# Patient Record
Sex: Female | Born: 1945 | Race: Black or African American | Hispanic: No | Marital: Single | Smoking: Never smoker
Health system: Southern US, Community
[De-identification: ages and names within clinical notes are randomized; demographics above are authoritative.]

## PROBLEM LIST (undated history)

## (undated) DIAGNOSIS — Z9989 Dependence on other enabling machines and devices: Secondary | ICD-10-CM

## (undated) DIAGNOSIS — I10 Essential (primary) hypertension: Secondary | ICD-10-CM

## (undated) HISTORY — PX: ABDOMINAL HYSTERECTOMY: SHX81

---

## 2011-09-04 ENCOUNTER — Encounter: Payer: Self-pay | Admitting: *Deleted

## 2011-09-04 ENCOUNTER — Emergency Department (INDEPENDENT_AMBULATORY_CARE_PROVIDER_SITE_OTHER)
Admission: EM | Admit: 2011-09-04 | Discharge: 2011-09-04 | Disposition: A | Discharge status: Home / Self Care | Payer: Federal, State, Local not specified - PPO | Source: Home / Self Care | Attending: Emergency Medicine | Admitting: Emergency Medicine

## 2011-09-04 DIAGNOSIS — R6889 Other general symptoms and signs: Secondary | ICD-10-CM

## 2011-09-04 HISTORY — DX: Essential (primary) hypertension: I10

## 2011-09-04 MED ORDER — OSELTAMIVIR PHOSPHATE 75 MG PO CAPS: 75.0000 mg | ORAL_CAPSULE | Freq: Two times a day (BID) | ORAL | Status: AC

## 2011-09-04 MED ORDER — GUAIFENESIN-CODEINE 100-10 MG/5ML PO SYRP: 10.0000 mL | ORAL_SOLUTION | Freq: Four times a day (QID) | ORAL | Status: AC | PRN

## 2011-09-04 NOTE — ED Provider Notes (Signed)
Chief Complaint:  "Cough and congestion"  History of Present Illness:  Sydney Daugherty is a 66 year old female who is visiting her sister in [place]. She has had a two-day history of cough productive of clear sputum, headache, malaise, fatigue, myalgias, chills, temperature of 101, nasal congestion, rhinorrhea, and nausea. She denies any chest pain, shortness of breath, or wheezing and she has had no sore throat, vomiting, or diarrhea. She has gotten the flu vaccine.  Review of Systems:  Other than noted above, the patient denies any of the following symptoms. Systemic:  No fever, chills, sweats, fatigue, myalgias, headache, or anorexia. Eye:  No redness, pain or drainage. ENT:  No earache, nasal congestion, rhinorrhea, sinus pressure, or sore throat. Lungs:  No cough, sputum production, wheezing, shortness of breath. Or chest pain. GI:  No nausea, vomiting, abdominal pain or diarrhea.  PMFSH:  Past medical history, family history, social history, meds, and allergies were reviewed.  Physical Exam:   Vital signs:  BP 140/82  Pulse 95  Temp(Src) 99.8 F (37.7 C) (Oral)  Resp 20  SpO2 97% General:  Alert, in no distress. Eye:  No conjunctival injection or drainage. ENT:  TMs and canals were normal, without erythema or inflammation.  Nasal mucosa was clear and uncongested, without drainage.  Mucous membranes were moist.  Pharynx was clear, without exudate or drainage.  There were no oral ulcerations or lesions. Neck:  Supple, no adenopathy, tenderness or mass. Lungs:  No respiratory distress.  Lungs were clear to auscultation, without wheezes, rales or rhonchi.  Breath sounds were clear and equal bilaterally. Heart:  Regular rhythm, without gallops, murmers or rubs. Skin:  Clear, warm, and dry, without rash or lesions.  Labs:  No results found for this or any previous visit.   Radiology:  No results found.  Assessment:   Diagnoses that have been ruled out:  Diagnoses that are still  under consideration:  Final diagnoses:  Influenza-like illness    Plan:   1.  The following meds were prescribed:   New Prescriptions   GUAIFENESIN-CODEINE (GUIATUSS AC) 100-10 MG/5ML SYRUP    Take 10 mLs by mouth 4 (four) times daily as needed for cough.   OSELTAMIVIR (TAMIFLU) 75 MG CAPSULE    Take 1 capsule (75 mg total) by mouth every 12 (twelve) hours.   2.  The patient was instructed in symptomatic care and handouts were given. 3.  The patient was told to return if becoming worse in any way, if no better in 3 or 4 days, and given some red flag symptoms that would indicate earlier return.   Roque Lias, MD 09/04/11 [phone]

## 2011-09-04 NOTE — ED Notes (Signed)
Pt is here with complaints of cough, fever and sinus HA.  Pt was treated 2 weeks ago for sinus infection with antibx.  Has history of asthma and environmental allergies.  Pt sees an allergist, has an appointment tomorrow.

## 2017-08-27 ENCOUNTER — Emergency Department (HOSPITAL_COMMUNITY): Payer: Federal, State, Local not specified - PPO

## 2017-08-27 ENCOUNTER — Emergency Department (HOSPITAL_COMMUNITY)
Admission: EM | Admit: 2017-08-27 | Discharge: 2017-08-27 | Disposition: A | Discharge status: Home / Self Care | Payer: Federal, State, Local not specified - PPO | Attending: Emergency Medicine | Admitting: Emergency Medicine

## 2017-08-27 ENCOUNTER — Other Ambulatory Visit: Payer: Self-pay

## 2017-08-27 ENCOUNTER — Encounter (HOSPITAL_COMMUNITY): Payer: Self-pay | Admitting: Emergency Medicine

## 2017-08-27 DIAGNOSIS — J45909 Unspecified asthma, uncomplicated: Secondary | ICD-10-CM | POA: Insufficient documentation

## 2017-08-27 DIAGNOSIS — Z79899 Other long term (current) drug therapy: Secondary | ICD-10-CM | POA: Diagnosis not present

## 2017-08-27 DIAGNOSIS — H60502 Unspecified acute noninfective otitis externa, left ear: Secondary | ICD-10-CM | POA: Diagnosis not present

## 2017-08-27 DIAGNOSIS — Z7984 Long term (current) use of oral hypoglycemic drugs: Secondary | ICD-10-CM | POA: Diagnosis not present

## 2017-08-27 DIAGNOSIS — E119 Type 2 diabetes mellitus without complications: Secondary | ICD-10-CM | POA: Insufficient documentation

## 2017-08-27 DIAGNOSIS — J209 Acute bronchitis, unspecified: Secondary | ICD-10-CM

## 2017-08-27 DIAGNOSIS — I10 Essential (primary) hypertension: Secondary | ICD-10-CM | POA: Diagnosis not present

## 2017-08-27 DIAGNOSIS — H9202 Otalgia, left ear: Secondary | ICD-10-CM | POA: Diagnosis present

## 2017-08-27 DIAGNOSIS — R0981 Nasal congestion: Secondary | ICD-10-CM

## 2017-08-27 HISTORY — DX: Dependence on other enabling machines and devices: Z99.89

## 2017-08-27 LAB — CBC WITH DIFFERENTIAL/PLATELET
Basophils Absolute: 0 10*3/uL (ref 0.0–0.1)
Basophils Relative: 1 %
Eosinophils Absolute: 0.2 10*3/uL (ref 0.0–0.7)
Eosinophils Relative: 2 %
HCT: 41.9 % (ref 36.0–46.0)
Hemoglobin: 13.5 g/dL (ref 12.0–15.0)
Lymphocytes Relative: 31 %
Lymphs Abs: 2.7 10*3/uL (ref 0.7–4.0)
MCH: 26.4 pg (ref 26.0–34.0)
MCHC: 32.2 g/dL (ref 30.0–36.0)
MCV: 81.8 fL (ref 78.0–100.0)
Monocytes Absolute: 0.7 10*3/uL (ref 0.1–1.0)
Monocytes Relative: 8 %
Neutro Abs: 5.3 10*3/uL (ref 1.7–7.7)
Neutrophils Relative %: 60 %
Platelets: 263 10*3/uL (ref 150–400)
RBC: 5.12 MIL/uL — ABNORMAL HIGH (ref 3.87–5.11)
RDW: 15.2 % (ref 11.5–15.5)
WBC: 8.8 10*3/uL (ref 4.0–10.5)

## 2017-08-27 LAB — I-STAT TROPONIN, ED: Troponin i, poc: 0.01 ng/mL (ref 0.00–0.08)

## 2017-08-27 LAB — BASIC METABOLIC PANEL
Anion gap: 6 (ref 5–15)
BUN: 15 mg/dL (ref 6–20)
CO2: 24 mmol/L (ref 22–32)
Calcium: 9.9 mg/dL (ref 8.9–10.3)
Chloride: 110 mmol/L (ref 101–111)
Creatinine, Ser: 1.05 mg/dL — ABNORMAL HIGH (ref 0.44–1.00)
GFR calc Af Amer: >60 mL/min (ref >60)
GFR calc non Af Amer: 52 mL/min — ABNORMAL LOW (ref >60)
Glucose, Bld: 87 mg/dL (ref 65–99)
Potassium: 4.3 mmol/L (ref 3.5–5.1)
Sodium: 140 mmol/L (ref 135–145)

## 2017-08-27 MED ORDER — DOXYCYCLINE HYCLATE 100 MG PO TABS
100.0000 mg | ORAL_TABLET | Freq: Once | ORAL | Status: AC
  Administered 2017-08-27: 100 mg via ORAL
  Filled 2017-08-27: qty 1

## 2017-08-27 MED ORDER — CIPROFLOXACIN-DEXAMETHASONE 0.3-0.1 % OT SUSP
4.0000 [drp] | Freq: Once | OTIC | Status: AC
  Administered 2017-08-27: 4 [drp] via OTIC
  Filled 2017-08-27: qty 7.5

## 2017-08-27 MED ORDER — DOXYCYCLINE HYCLATE 100 MG PO CAPS: 100.0000 mg | ORAL_CAPSULE | Freq: Two times a day (BID) | ORAL | 0 refills | Status: AC

## 2017-08-27 NOTE — ED Triage Notes (Signed)
[place] visiting from [place], started having shortness of breath last night, with a fever, left ear pain, congestion.  Pt has had her flu shot, works for [employer]- full time.

## 2017-08-27 NOTE — ED Provider Notes (Signed)
[facility] EMERGENCY [facility] Provider Note   CSN: [id number] Arrival date & time: 08/27/17  1433     History   Chief Complaint Chief Complaint  Patient presents with  . URI  . Fever    HPI Sydney Daugherty is a 71 y.o. female   With history of asthma, DM, HTN presents with chief complaint acute onset, progressively worsening left ear pain and nasal congestion since yesterday.  Patient is traveling from [place] and visiting family for the holidays.  She states that she works at the [employer] it is frequently around physicians and medical professionals that are around sick people she states that on her train ride down she was also in close proximity to a woman  with a cough.  She states that when she arrived to her sister's house, her sister noted she appeared short of breath.  Patient notes intermittent shortness of breath which worsens with activity.  Also endorses orthopnea.  She states she also developed aching left ear pain and left-sided facial pain with radiation to the neck.  She endorses mild sore throat.  Also endorses nasal congestion.  She has tried over-the-counter eardrops and cold medicines with some relief.  She endorses subjective fevers and chills.  She also notes bilateral lower extremity edema which developed yesterday.  She endorses nonproductive cough.  No recent travel or surgeries, no hemoptysis, no prior history of DVT or PE. She is a nonsmoker.  She had a short period of nausea after her meal earlier today but denies abdominal pain, vomiting, or other abdominal symptoms.  The history is provided by the patient.    Past Medical History:  Diagnosis Date  . Asthma   . CPAP (continuous positive airway pressure) dependence    as needed not used in 3-4 years  . Diabetes mellitus   . Hypertension     There are no active problems to display for this patient.   Past Surgical History:  Procedure Laterality Date  . ABDOMINAL HYSTERECTOMY      OB  History    No data available       Home Medications    Prior to Admission medications   Medication Sig Start Date End Date Taking? Authorizing Provider  albuterol (PROVENTIL HFA;VENTOLIN HFA) 108 (90 BASE) MCG/ACT inhaler Inhale 2 puffs into the lungs every 6 (six) hours as needed.     Yes [provider]  amLODipine-olmesartan (AZOR) 10-40 MG tablet Take 1 tablet by mouth daily. 07/30/17  Yes [provider]  B Complex Vitamins (VITAMIN B COMPLEX PO) Take 1 tablet by mouth daily.   Yes [provider]  CLONIDINE HCL PO Take 0.5 mg by mouth at bedtime.   Yes [provider]  ezetimibe-simvastatin (VYTORIN) 10-80 MG per tablet Take 1 tablet by mouth at bedtime.     Yes [provider]  Garlic 10 MG CAPS Take 1 tablet by mouth daily as needed.   Yes [provider]  glyBURIDE (DIABETA) 5 MG tablet Take 5 mg by mouth 2 (two) times daily with a meal.    Yes [provider]  Levothyroxine Sodium 137 MCG CAPS Take 137 mcg by mouth daily.    Yes [provider]  metFORMIN (GLUCOPHAGE) 500 MG tablet Take 500 mg by mouth 2 (two) times daily with a meal.     Yes [provider]  Multiple Vitamin (MULTIVITAMIN) capsule Take 1 capsule by mouth daily.   Yes [provider]  niacin (NIASPAN)  500 MG CR tablet Take 500 mg by mouth at bedtime.  08/01/17  Yes [provider]  SYMBICORT 160-4.5 MCG/ACT inhaler Inhale 2 puffs into the lungs 2 (two) times daily. 08/23/17  Yes [provider]  triamcinolone (NASACORT) 55 MCG/ACT nasal inhaler Place 2 sprays into the nose daily.     Yes [provider]  doxycycline (VIBRAMYCIN) 100 MG capsule Take 1 capsule (100 mg total) by mouth 2 (two) times daily for 7 days. 08/27/17 09/03/17  Jeanie SewerFawze, Lannis Lichtenwalner A, PA-C    Family History No family history on file.  Social History Social History   Tobacco Use  . Smoking status: Never Smoker  . Smokeless tobacco:  Never Used  Substance Use Topics  . Alcohol use: No  . Drug use: No     Allergies   Patient has no known allergies.   Review of Systems Review of Systems  Constitutional: Positive for chills and fever.  HENT: Positive for congestion, ear pain, sinus pressure and sore throat. Negative for drooling and ear discharge.   Respiratory: Positive for cough and shortness of breath.   Cardiovascular: Negative for chest pain.  Gastrointestinal: Positive for nausea. Negative for abdominal pain and vomiting.  All other systems reviewed and are negative.    Physical Exam Updated Vital Signs BP 137/60   Pulse 70   Temp 98.9 F (37.2 C) (Oral)   Resp 13   Ht 5\' 8"  (1.727 m)   Wt 108.9 kg (240 lb)   SpO2 98%   BMI 36.49 kg/m   Physical Exam  Constitutional: She is oriented to person, place, and time. She appears well-developed and well-nourished. No distress.  HENT:  Head: Normocephalic and atraumatic.  Right Ear: External ear normal.  Left Ear: External ear normal.  Left TM with mid-ear effusion, mild erythema, no bulging.  There is mild erythema and edema of the left ear canal.  No pain with palpation of the pinna, tragus, or mastoids bilaterally.  No abnormal drainage noted.  Left maxillary sinus tenderness to palpation with mucosal edema noted.  Nasal septum is midline.  Posterior oropharynx with postnasal drip but no tonsillar hypertrophy, exudates, uvular deviation, trismus, or sublingual abnormalities.  No facial swelling noted.  Eyes: Conjunctivae and EOM are normal. Pupils are equal, round, and reactive to light. Right eye exhibits no discharge. Left eye exhibits no discharge.  Neck: Normal range of motion. Neck supple. No JVD present. No tracheal deviation present.  Left anterior cervical LAD  Cardiovascular: Normal rate, regular rhythm, normal heart sounds and intact distal pulses.  2+ radial and DP/PT pulses bl, negative Homan's bl, 2+ nonpitting edema to the BLE.     Pulmonary/Chest: Effort normal and breath sounds normal. No stridor. No respiratory distress. She has no wheezes. She has no rales. She exhibits no tenderness.  Abdominal: Soft. Bowel sounds are normal. She exhibits no distension. There is no tenderness.  Musculoskeletal: Normal range of motion. She exhibits no edema.  Lymphadenopathy:    She has cervical adenopathy.  Neurological: She is alert and oriented to person, place, and time.  Skin: Skin is warm and dry. No erythema.  Psychiatric: She has a normal mood and affect. Her behavior is normal.  Nursing note and vitals reviewed.    ED Treatments / Results  Labs (all labs ordered are listed, but only abnormal results are displayed) Labs Reviewed  BASIC METABOLIC PANEL - Abnormal; Notable for the following components:      Result Value  Creatinine, Ser 1.05 (*)    GFR calc non Af Amer 52 (*)    All other components within normal limits  CBC WITH DIFFERENTIAL/PLATELET - Abnormal; Notable for the following components:   RBC 5.12 (*)    All other components within normal limits  I-STAT TROPONIN, ED    EKG  EKG Interpretation  Date/Time:  Tuesday August 27 2017 18:47:56 EST Ventricular Rate:  76 PR Interval:    QRS Duration: 84 QT Interval:  404 QTC Calculation: 455 R Axis:   -13 Text Interpretation:  Sinus rhythm Anterior infarct, old No old tracing to compare Confirmed by Shaune Pollack [phone]) on 08/27/2017 7:23:40 PM       Radiology Dg Chest 2 View  Result Date: 08/27/2017 CLINICAL DATA:  71 year old female with shortness of breath. EXAM: CHEST  2 VIEW COMPARISON:  None. FINDINGS: The lungs are clear. There is no pleural effusion or pneumothorax. The cardiac silhouette is within normal limits. There is a moderate size hiatal hernia. No acute osseous pathology. IMPRESSION: 1. No acute cardiopulmonary process. 2. Moderate hiatal hernia. Electronically Signed   By: Elgie Collard M.D.   On: 08/27/2017 19:05     Procedures Procedures (including critical care time)  Medications Ordered in ED Medications  doxycycline (VIBRA-TABS) tablet 100 mg (100 mg Oral Given 08/27/17 2014)  ciprofloxacin-dexamethasone (CIPRODEX) 0.3-0.1 % OTIC (EAR) suspension 4 drop (4 drops Left EAR Given 08/27/17 2015)     Initial Impression / Assessment and Plan / ED Course  I have reviewed the triage vital signs and the nursing notes.  Pertinent labs & imaging results that were available during my care of the patient were reviewed by me and considered in my medical decision making (see chart for details).     Patient with left ear pain, nasal congestion, and cough as well as shortness of breath.  Afebrile, vital signs are stable.  Lungs are clear to auscultation.  Chest x-ray shows no acute cardia pulmonary abnormality such as pneumonia or pleural effusion.  I doubt CHF exacerbation in a patient who is otherwise relatively healthy and has no prior history of CHF.  Doubt ACS or MI given no chest pain.  Lab work is reassuring, with no leukocytosis or significant electrolyte abnormalities.  History and physical examination consistent with acute bronchitis and URI.  She appears to have a slight left-sided otitis externa which presumably occurred while using a Q-tip with over-the-counter medication to treat her earache.  Will give her Ciprodex drops in the ED to take home and use for the next 5 days.  We will also discharged with doxycycline for bronchitis.  Discussed symptomatic treatment for her symptoms as well.  Discussed indications for return to the ED.  Pt verbalized understanding of and agreement with plan and is safe for discharge home at this time.  Patient has no complaints prior to discharge.  Patient seen and evaluated by Dr. Erma Heritage who agrees with assessment and plan at this time.  Final Clinical Impressions(s) / ED Diagnoses   Final diagnoses:  Acute noninfective otitis externa of left ear, unspecified type   Nasal congestion  Acute bronchitis, unspecified organism    ED Discharge Orders        Ordered    doxycycline (VIBRAMYCIN) 100 MG capsule  2 times daily     08/27/17 1958       Bennye Alm 08/28/17 0026    Shaune Pollack, MD 08/28/17 [phone]

## 2017-08-27 NOTE — ED Notes (Signed)
Pt taken to Xray.

## 2017-08-27 NOTE — Discharge Instructions (Signed)
Please take all of your antibiotics until finished!   You may develop abdominal discomfort or diarrhea from the antibiotic.  You may help offset this with probiotics which you can buy or get in yogurt. Do not eat  or take the probiotics until 2 hours after your antibiotic. Apply your antibiotic drops to the left ear as directed. You appear to have a mild external ear infection in the left ear.    Continue to stay well-hydrated. Gargle warm salt water and spit it out for sore throat. May also use cough drops, warm teas, etc. Take flonase to decrease nasal congestion.    Followup with your primary care doctor in 5-7 days for recheck of ongoing symptoms. Return to emergency department for emergent changing or worsening of symptoms such as throat tightness, facial swelling, fever not controlled by ibuprofen or Tylenol,difficulty breathing, or chest pain.

## 2017-09-03 ENCOUNTER — Other Ambulatory Visit: Payer: Self-pay

## 2017-09-03 ENCOUNTER — Encounter (HOSPITAL_COMMUNITY): Payer: Self-pay | Admitting: Emergency Medicine

## 2017-09-03 ENCOUNTER — Ambulatory Visit (HOSPITAL_COMMUNITY)
Admission: EM | Admit: 2017-09-03 | Discharge: 2017-09-03 | Disposition: A | Discharge status: Home / Self Care | Payer: Federal, State, Local not specified - PPO

## 2017-09-03 DIAGNOSIS — B349 Viral infection, unspecified: Secondary | ICD-10-CM | POA: Diagnosis not present

## 2017-09-03 NOTE — Discharge Instructions (Signed)
Stay the course.  Okay to take tylenol 1000 mg every 8 hours for pain as needed.

## 2017-09-03 NOTE — ED Triage Notes (Signed)
Pt was in the ER christmas dx with bronchitis, was given antibiotics and ear drops. Pt states shes been feeling better but the L ear still hurts.

## 2017-09-03 NOTE — ED Provider Notes (Signed)
09/03/2017 11:42 AM   DOB: 10/15/1945 / MRN: [id number]  SUBJECTIVE:  Sydney Daugherty is a 72 y.o. female presenting for ear pain.  Was seen in the ED on christmas and was diagnosed with brohnchitis and otitis externa and was given doxy and cipro drop. [place] to [place] tomorrow on the train.  She overall is feeling better. She takes symbicort and nasacort for allergies and mild asthma.    She has No Known Allergies.   She  has a past medical history of Asthma, CPAP (continuous positive airway pressure) dependence, Diabetes mellitus, and Hypertension.    She  reports that  has never smoked. she has never used smokeless tobacco. She reports that she does not drink alcohol or use drugs. She  has no sexual activity history on file. The patient  has a past surgical history that includes Abdominal hysterectomy.  Her family history is not on file.  Review of Systems  Constitutional: Negative for chills, diaphoresis, fever and malaise/fatigue.  HENT: Positive for congestion.   Respiratory: Positive for cough and sputum production. Negative for shortness of breath and wheezing.   Cardiovascular: Negative for chest pain.  Gastrointestinal: Negative for nausea.  Genitourinary: Negative.   Skin: Negative for itching and rash.  Neurological: Negative for dizziness.    OBJECTIVE:  BP (!) 156/94 (BP Location: Left Arm) Comment: notified tina  Pulse 66   Temp 98.2 F (36.8 C) (Oral)   Resp 18   SpO2 97%   Physical Exam  Constitutional: She is oriented to person, place, and time. She is active.  Non-toxic appearance.  HENT:  Right Ear: Hearing, tympanic membrane, external ear and ear canal normal.  Left Ear: Hearing, tympanic membrane, external ear and ear canal normal.  Nose: Nose normal. Right sinus exhibits no maxillary sinus tenderness and no frontal sinus tenderness. Left sinus exhibits no maxillary sinus tenderness and no frontal sinus tenderness.  Mouth/Throat: Uvula  is midline, oropharynx is clear and moist and mucous membranes are normal. Mucous membranes are not dry. No oropharyngeal exudate, posterior oropharyngeal edema or tonsillar abscesses.  Eyes: EOM are normal. Pupils are equal, round, and reactive to light.  Cardiovascular: Normal rate, regular rhythm, S1 normal, S2 normal, normal heart sounds and intact distal pulses. Exam reveals no gallop, no friction rub and no decreased pulses.  No murmur heard. Pulmonary/Chest: Effort normal and breath sounds normal. No stridor. No tachypnea. No respiratory distress. She has no wheezes. She has no rales.  Abdominal: She exhibits no distension. There is no tenderness. There is no rebound and no guarding.  Musculoskeletal: She exhibits no edema.  Lymphadenopathy:       Head (right side): No submandibular and no tonsillar adenopathy present.       Head (left side): No submandibular and no tonsillar adenopathy present.    She has no cervical adenopathy.  Neurological: She is alert and oriented to person, place, and time. She has normal strength and normal reflexes. She is not disoriented. She displays no atrophy and normal reflexes. No cranial nerve deficit or sensory deficit. She exhibits normal muscle tone. Coordination and gait normal.  Skin: Skin is warm and dry. She is not diaphoretic. No pallor.  Psychiatric: Her behavior is normal.    No results found for this or any previous visit (from the past 72 hour(s)).  No results found.  ASSESSMENT AND PLAN:  The encounter diagnosis was Viral syndrome. She looks great today.  She is finishing doxy and cipro  drops.     The patient is advised to call or return to clinic if she does not see an improvement in symptoms, or to seek the care of the closest emergency department if she worsens with the above plan.   Deliah Boston, MHS, PA-C 09/03/2017 11:42 AM    Ofilia Neas, PA-C 09/03/17 1143

## 2018-05-30 DIAGNOSIS — K449 Diaphragmatic hernia without obstruction or gangrene: Secondary | ICD-10-CM | POA: Insufficient documentation

## 2018-06-13 DIAGNOSIS — D124 Benign neoplasm of descending colon: Secondary | ICD-10-CM | POA: Insufficient documentation

## 2019-07-07 IMAGING — DX DG CHEST 2V
2 series · 2 of 2 positions shown · non-contrast
Comparison: None.

CLINICAL DATA: 71-year-old female with shortness of breath.

EXAM:
CHEST  2 VIEW

[w chest pa]
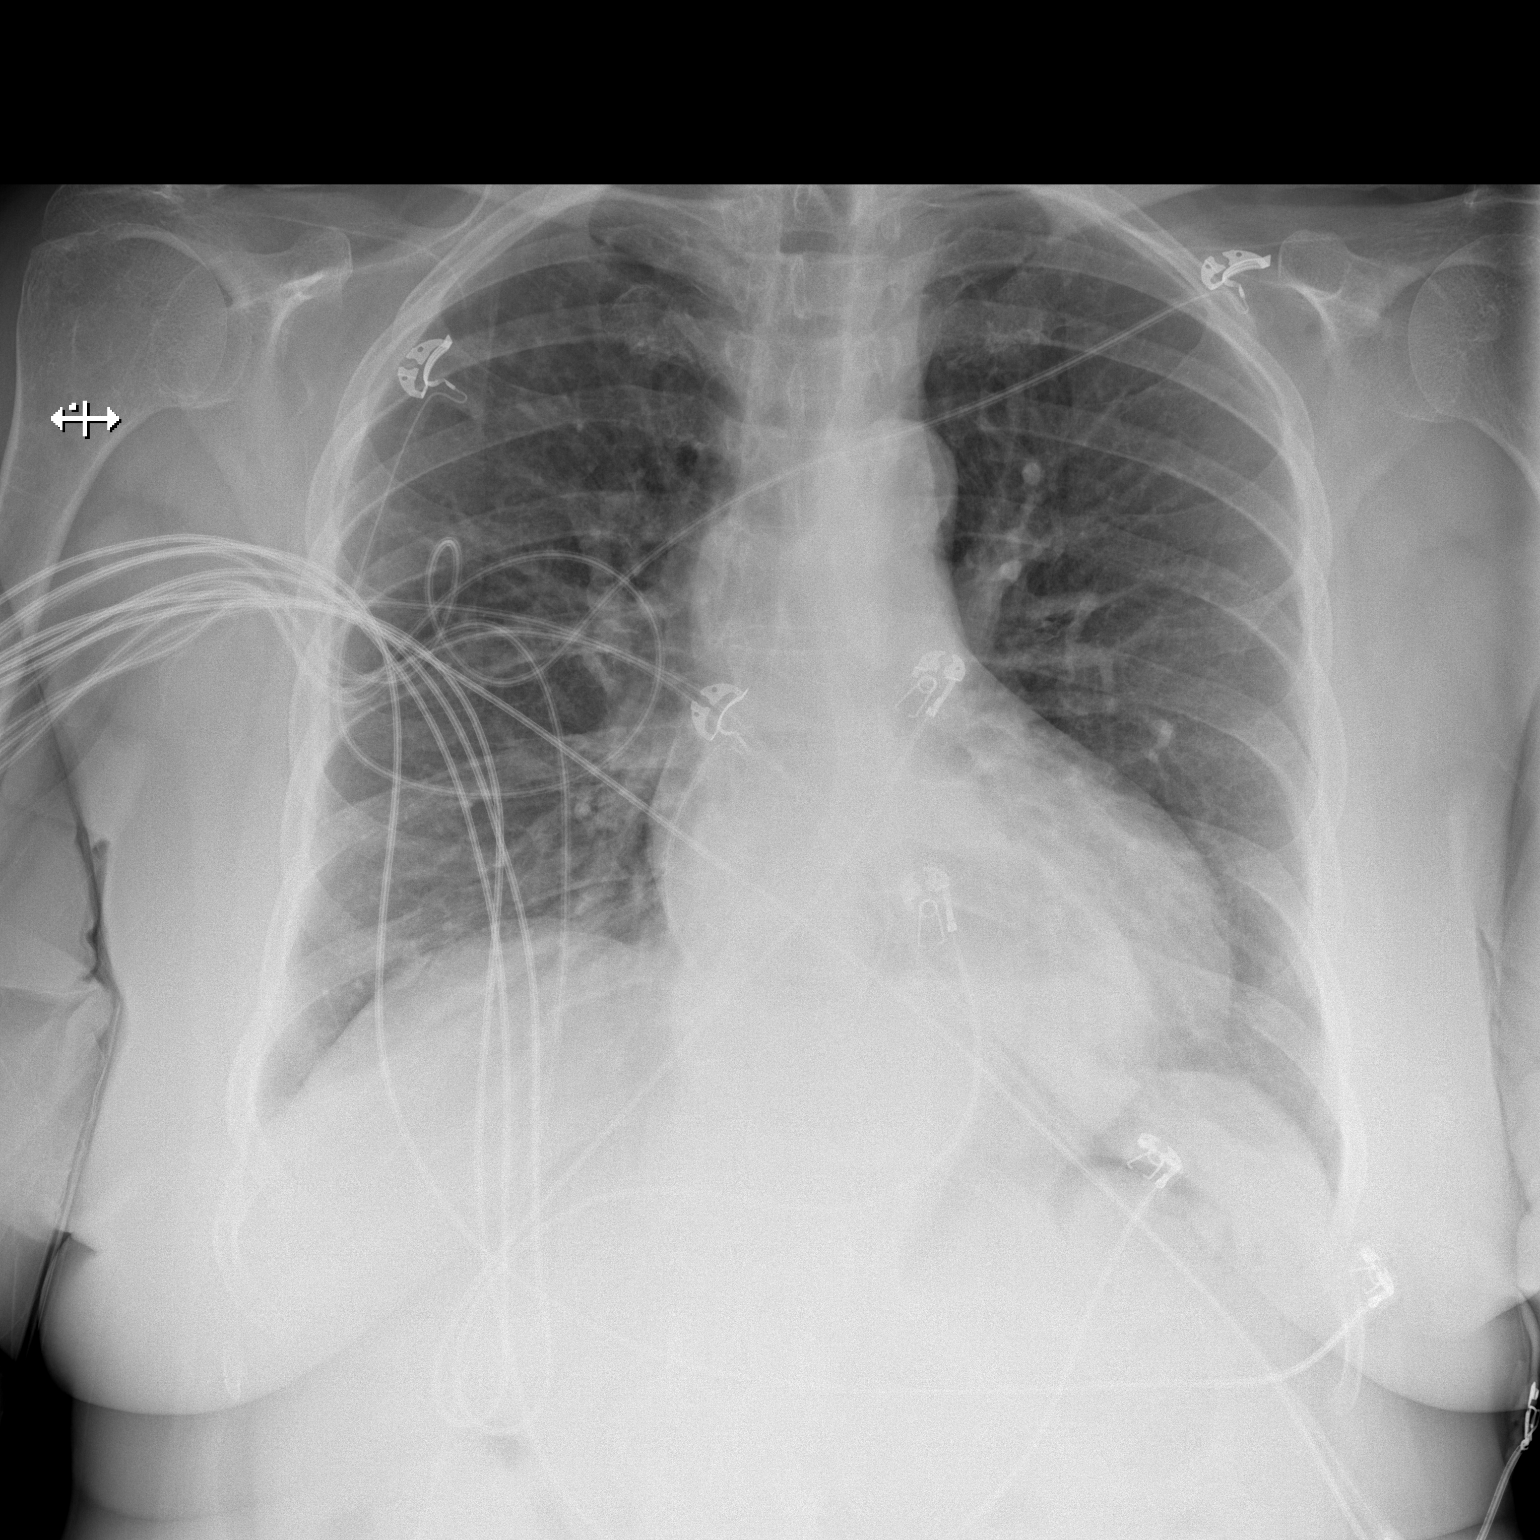

[w chest lat]
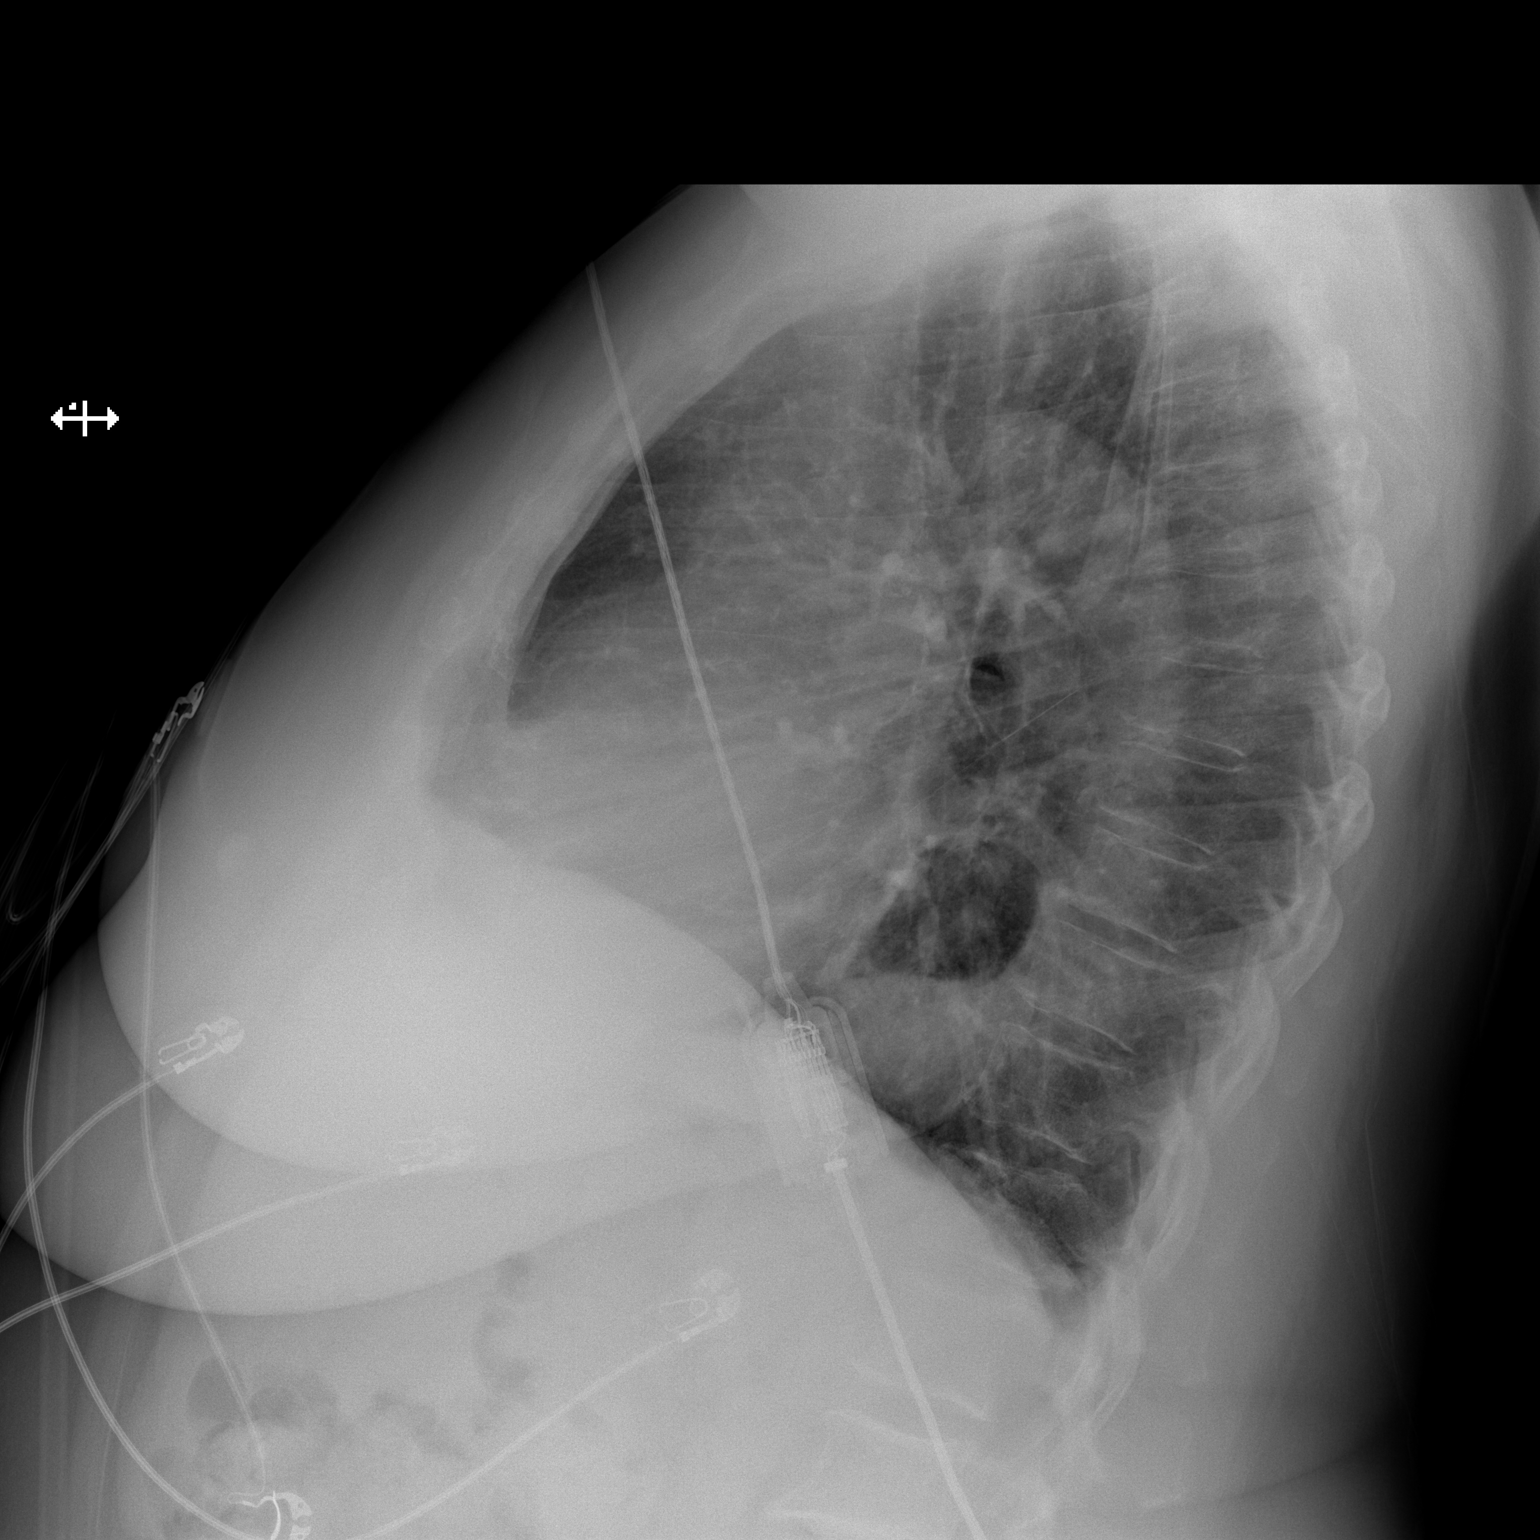

[2 of 2 positions shown; findings below may reference images not displayed]

FINDINGS: The lungs are clear. There is no pleural effusion or pneumothorax.
The cardiac silhouette is within normal limits. There is a moderate
size hiatal hernia. No acute osseous pathology.
IMPRESSION: 1. No acute cardiopulmonary process.
2. Moderate hiatal hernia.

## 2022-05-22 DIAGNOSIS — K5730 Diverticulosis of large intestine without perforation or abscess without bleeding: Secondary | ICD-10-CM | POA: Insufficient documentation

## 2023-03-11 ENCOUNTER — Other Ambulatory Visit: Payer: Self-pay

## 2023-03-11 MED ORDER — LEVOTHYROXINE SODIUM 125 MCG PO TABS
125.0000 ug | ORAL_TABLET | Freq: Every day | ORAL | 8 refills | Status: AC
  Filled 2023-03-11: qty 90, 90d supply, fill #0
  Filled 2023-03-11: qty 30, 30d supply, fill #0

## 2023-03-11 MED ORDER — MOUNJARO 7.5 MG/0.5ML ~~LOC~~ SOAJ
7.5000 mg | SUBCUTANEOUS | 0 refills | Status: AC
  Filled 2023-03-11: qty 2, 28d supply, fill #0

## 2023-08-07 ENCOUNTER — Ambulatory Visit (HOSPITAL_COMMUNITY)
Admission: EM | Admit: 2023-08-07 | Discharge: 2023-08-07 | Disposition: A | Discharge status: Home / Self Care | Payer: Federal, State, Local not specified - PPO | Attending: Physician Assistant | Admitting: Physician Assistant

## 2023-08-07 ENCOUNTER — Encounter (HOSPITAL_COMMUNITY): Payer: Self-pay

## 2023-08-07 DIAGNOSIS — R051 Acute cough: Secondary | ICD-10-CM | POA: Insufficient documentation

## 2023-08-07 DIAGNOSIS — U071 COVID-19: Secondary | ICD-10-CM | POA: Diagnosis present

## 2023-08-07 LAB — BASIC METABOLIC PANEL
Anion gap: 11 (ref 5–15)
BUN: 11 mg/dL (ref 8–23)
CO2: 21 mmol/L — ABNORMAL LOW (ref 22–32)
Calcium: 9.8 mg/dL (ref 8.9–10.3)
Chloride: 106 mmol/L (ref 98–111)
Creatinine, Ser: 0.96 mg/dL (ref 0.44–1.00)
GFR, Estimated: >60 mL/min (ref >60)
Glucose, Bld: 122 mg/dL — ABNORMAL HIGH (ref 70–99)
Potassium: 3.5 mmol/L (ref 3.5–5.1)
Sodium: 138 mmol/L (ref 135–145)

## 2023-08-07 LAB — POC COVID19/FLU A&B COMBO
Covid Antigen, POC: POSITIVE — AB
Influenza A Antigen, POC: NEGATIVE
Influenza B Antigen, POC: NEGATIVE

## 2023-08-07 MED ORDER — PAXLOVID (300/100) 20 X 150 MG & 10 X 100MG PO TBPK: 3.0000 | ORAL_TABLET | Freq: Two times a day (BID) | ORAL | 0 refills | Status: AC

## 2023-08-07 MED ORDER — BENZONATATE 100 MG PO CAPS: 100.0000 mg | ORAL_CAPSULE | Freq: Three times a day (TID) | ORAL | 0 refills | Status: DC

## 2023-08-07 NOTE — Discharge Instructions (Signed)
You tested positive for COVID.  I have sent in Tessalon to help with your cough.  We are going to start Paxlovid which should help your body fight the virus.  I will call this in as soon as I have your blood work back and we will call you with this result.  While you are on Paxlovid, do not take your amlodipine/olmesartan and ezetimibe/simvastatin while on the medication and for 3 days after you finish it (for the next 8 days).  Use over-the-counter medications as needed.  Make sure that you rest and drink plenty of fluid.  I recommend that you obtain a pulse oximeter from the pharmacy and monitor your oxygen saturation.  If you notice your oxygen dropping below 93% you should be reevaluated if it goes below 90% you should go to the emergency room.  If anything worsens you have chest pain, worsening cough, shortness of breath, weakness you should go to the ER.

## 2023-08-07 NOTE — ED Triage Notes (Signed)
Patient here today with c/o cough, runny nose, some chills, and right ear pain since yesterday. Last week she was having some right ear pain and she had taken Allegra which seemed to help but it came back. Her sister is also sick.

## 2023-08-07 NOTE — ED Provider Notes (Addendum)
[facility]    CSN: [id number] Arrival date & time: 08/07/23  1101      History   Chief Complaint Chief Complaint  Patient presents with   Cough    HPI Sydney Daugherty is a 77 y.o. female.   Patient presents today with a 24-hour history of URI symptoms.  Reports cough, rhinorrhea, chills, otalgia.  Denies any chest pain, shortness of breath, nausea, vomiting, fever.  She reports her sister has also been sick.  She does have a history of asthma but has not required maintenance or rescue medication recently.  Denies hospitalization related to asthma.  She does not smoke and denies any history of allergies.  She has been taking Allegra regularly as well as over-the-counter Tylenol without improvement of symptoms.  She denies any recent antibiotics or steroids.  She is eating and drinking normally and denies any GI symptoms.  She has never had COVID.  She is up-to-date on COVID-19 and influenza vaccinations.    Past Medical History:  Diagnosis Date   Asthma    CPAP (continuous positive airway pressure) dependence    as needed not used in 3-4 years   Diabetes mellitus    Hypertension     There are no problems to display for this patient.   Past Surgical History:  Procedure Laterality Date   ABDOMINAL HYSTERECTOMY      OB History   No obstetric history on file.      Home Medications    Prior to Admission medications   Medication Sig Start Date End Date Taking? Authorizing Provider  benzonatate (TESSALON) 100 MG capsule Take 1 capsule (100 mg total) by mouth every 8 (eight) hours. 08/07/23  Yes Charmaine Placido K, PA-C  amLODipine-olmesartan (AZOR) 10-40 MG tablet Take 1 tablet by mouth daily. 07/30/17   [provider]  B Complex Vitamins (VITAMIN B COMPLEX PO) Take 1 tablet by mouth daily.    [provider]  CLONIDINE HCL PO Take 0.5 mg by mouth at bedtime.    [provider]  ezetimibe-simvastatin (VYTORIN) 10-80 MG per tablet  Take 1 tablet by mouth at bedtime.      [provider]  Garlic 10 MG CAPS Take 1 tablet by mouth daily as needed.    [provider]  levothyroxine (SYNTHROID) 125 MCG tablet Take 1 tablet (125 mcg total) by mouth daily. 11/13/22     metFORMIN (GLUCOPHAGE) 500 MG tablet Take 500 mg by mouth 2 (two) times daily with a meal.      [provider]  Multiple Vitamin (MULTIVITAMIN) capsule Take 1 capsule by mouth daily.    [provider]  tirzepatide Greggory Keen) 7.5 MG/0.5ML Pen Inject 7.5 mg into the skin once a week. 10/16/22     triamcinolone (NASACORT) 55 MCG/ACT nasal inhaler Place 2 sprays into the nose daily.      [provider]    Family History History reviewed. No pertinent family history.  Social History Social History   Tobacco Use   Smoking status: Never   Smokeless tobacco: Never  Vaping Use   Vaping status: Never Used  Substance Use Topics   Alcohol use: No   Drug use: No     Allergies   Patient has no known allergies.   Review of Systems Review of Systems  Constitutional:  Positive for activity change and chills. Negative for appetite change, fatigue and fever.  HENT:  Positive for congestion and ear pain. Negative for postnasal drip, sinus  pressure, sneezing and sore throat.   Respiratory:  Positive for cough. Negative for shortness of breath.   Cardiovascular:  Negative for chest pain.  Gastrointestinal:  Negative for abdominal pain, diarrhea, nausea and vomiting.  Neurological:  Negative for dizziness, light-headedness and headaches.     Physical Exam Triage Vital Signs ED Triage Vitals  Encounter Vitals Group     BP 08/07/23 1123 (!) 147/107     Systolic BP Percentile --      Diastolic BP Percentile --      Pulse Rate 08/07/23 1123 (!) 102     Resp 08/07/23 1123 16     Temp 08/07/23 1123 99.5 F (37.5 C)     Temp Source 08/07/23 1123 Oral     SpO2 08/07/23 1123 95 %     Weight 08/07/23 1123 191 lb (86.6  kg)     Height 08/07/23 1123 5\' 8"  (1.727 m)     Head Circumference --      Peak Flow --      Pain Score 08/07/23 1121 0     Pain Loc --      Pain Education --      Exclude from Growth Chart --    No data found.  Updated Vital Signs BP (!) 147/107 (BP Location: Right Arm)   Pulse 96   Temp 99.5 F (37.5 C) (Oral)   Resp 16   Ht 5\' 8"  (1.727 m)   Wt 191 lb (86.6 kg)   SpO2 97%   BMI 29.04 kg/m   Visual Acuity Right Eye Distance:   Left Eye Distance:   Bilateral Distance:    Right Eye Near:   Left Eye Near:    Bilateral Near:     Physical Exam Vitals reviewed.  Constitutional:      General: She is awake. She is not in acute distress.    Appearance: Normal appearance. She is well-developed. She is not ill-appearing.     Comments: Very pleasant female appears stated age in no acute distress sitting comfortably in exam room  HENT:     Head: Normocephalic and atraumatic.     Right Ear: Tympanic membrane, ear canal and external ear normal. Tympanic membrane is not erythematous or bulging.     Left Ear: Tympanic membrane, ear canal and external ear normal. Tympanic membrane is not erythematous or bulging.     Nose:     Right Sinus: No maxillary sinus tenderness or frontal sinus tenderness.     Left Sinus: No maxillary sinus tenderness or frontal sinus tenderness.     Mouth/Throat:     Pharynx: Uvula midline. No oropharyngeal exudate or posterior oropharyngeal erythema.  Cardiovascular:     Rate and Rhythm: Normal rate and regular rhythm.     Heart sounds: S1 normal and S2 normal. Murmur heard.  Pulmonary:     Effort: Pulmonary effort is normal.     Breath sounds: Normal breath sounds. No wheezing, rhonchi or rales.     Comments: Clear to auscultation bilaterally Musculoskeletal:     Right lower leg: No edema.     Left lower leg: No edema.  Psychiatric:        Behavior: Behavior is cooperative.      UC Treatments / Results  Labs (all labs ordered are listed,  but only abnormal results are displayed) Labs Reviewed  POC COVID19/FLU A&B COMBO - Abnormal; Notable for the following components:      Result Value   Covid Antigen, POC  Positive (*)    All other components within normal limits  BASIC METABOLIC PANEL    EKG   Radiology No results found.  Procedures Procedures (including critical care time)  Medications Ordered in UC Medications - No data to display  Initial Impression / Assessment and Plan / UC Course  I have reviewed the triage vital signs and the nursing notes.  Pertinent labs & imaging results that were available during my care of the patient were reviewed by me and considered in my medical decision making (see chart for details).     Patient is well-appearing, afebrile, nontoxic.  She was mildly tachycardic on intake but this normalized on recheck.  She tested positive for COVID-19.  Chest x-ray was deferred as she had no adventitious lung sounds and her saturation was 97%.  Given her age and history of diabetes, asthma she is a candidate for antiviral therapy.  Will start Paxlovid.  We do not have a recent metabolic panel on file or in Care Everywhere so this was ordered stat and we will dose the medication as soon as we have this result available.  She was instructed to hold her amlodipine/olmesartan and ezetimibe/simvastatin while on this medication for 3 days after completing course.  She can use Tessalon for cough.  Recommended that she rest and drink plenty of fluid.  We discussed that if her symptoms worsen in any way she would need to go to the emergency room.  She is to monitor her oxygen saturation and be reevaluated if this drops below 93% and go to the ER with any oxygen saturation at 90% or below.  Strict turn precautions were given.  All questions were answered to her satisfaction.  Final Clinical Impressions(s) / UC Diagnoses   Final diagnoses:  COVID-19  Acute cough     Discharge Instructions      You  tested positive for COVID.  I have sent in Tessalon to help with your cough.  We are going to start Paxlovid which should help your body fight the virus.  I will call this in as soon as I have your blood work back and we will call you with this result.  While you are on Paxlovid, do not take your amlodipine/olmesartan and ezetimibe/simvastatin while on the medication and for 3 days after you finish it (for the next 8 days).  Use over-the-counter medications as needed.  Make sure that you rest and drink plenty of fluid.  I recommend that you obtain a pulse oximeter from the pharmacy and monitor your oxygen saturation.  If you notice your oxygen dropping below 93% you should be reevaluated if it goes below 90% you should go to the emergency room.  If anything worsens you have chest pain, worsening cough, shortness of breath, weakness you should go to the ER.     ED Prescriptions     Medication Sig Dispense Auth. Provider   benzonatate (TESSALON) 100 MG capsule Take 1 capsule (100 mg total) by mouth every 8 (eight) hours. 21 capsule Jerusalem Wert K, PA-C      PDMP not reviewed this encounter.   Jeani Hawking, PA-C 08/07/23 1217    Miroslava Santellan, Noberto Retort, PA-C 08/07/23 1342

## 2023-08-15 ENCOUNTER — Encounter (HOSPITAL_COMMUNITY): Payer: Self-pay

## 2023-08-15 ENCOUNTER — Ambulatory Visit (HOSPITAL_COMMUNITY)
Admission: EM | Admit: 2023-08-15 | Discharge: 2023-08-15 | Disposition: A | Discharge status: Home / Self Care | Payer: Federal, State, Local not specified - PPO | Attending: Family Medicine | Admitting: Family Medicine

## 2023-08-15 DIAGNOSIS — J0100 Acute maxillary sinusitis, unspecified: Secondary | ICD-10-CM

## 2023-08-15 MED ORDER — AMOXICILLIN 500 MG PO TABS: 500.0000 mg | ORAL_TABLET | Freq: Three times a day (TID) | ORAL | 0 refills | Status: AC

## 2023-08-15 MED ORDER — FLUTICASONE PROPIONATE 50 MCG/ACT NA SUSP: 2.0000 | Freq: Every day | NASAL | 0 refills | Status: DC

## 2023-08-15 NOTE — Discharge Instructions (Addendum)
I think you have possibly developed a sinus infection  Take amoxicillin 500 mg--1 tab 3 times daily for 5 days  Fluticasone/Flonase nose spray--put 2 sprays in each nostril once daily  Please restart all your usual medications today

## 2023-08-15 NOTE — ED Provider Notes (Signed)
[facility]    CSN: [id number] Arrival date & time: 08/15/23  1027      History   Chief Complaint Chief Complaint  Patient presents with   Covid Positive   Nasal Congestion    HPI Sydney Daugherty is a 77 y.o. female.   HPI Here for nasal congestion and sinus pressure that she really noticed since yesterday.  She is also had a little bit more cough but that has improved today.  Also yesterday she felt a little tired or weak but that has improved.  No fever or chills or nausea or vomiting or diarrhea.  On December 4 she was seen here and tested positive for COVID.  She did have a lot more nasal congestion then.  Her symptoms improved and she finished the renal dosing of Paxlovid on December 9.  She was told to stop taking her statin while she was on the Paxlovid but the patient on her own decided to stop all her medications, including her antihypertensives and her medicines for diabetes.  She is allergic to Jardiance   Past Medical History:  Diagnosis Date   Asthma    CPAP (continuous positive airway pressure) dependence    as needed not used in 3-4 years   Diabetes mellitus    Hypertension     There are no active problems to display for this patient.   Past Surgical History:  Procedure Laterality Date   ABDOMINAL HYSTERECTOMY      OB History   No obstetric history on file.      Home Medications    Prior to Admission medications   Medication Sig Start Date End Date Taking? Authorizing Provider  amLODipine-olmesartan (AZOR) 10-40 MG tablet Take 1 tablet by mouth daily. 07/30/17  Yes [provider]  amoxicillin (AMOXIL) 500 MG tablet Take 1 tablet (500 mg total) by mouth in the morning, at noon, and at bedtime for 5 days. 08/15/23 08/20/23 Yes BanisterJanace Aris, MD  B Complex Vitamins (VITAMIN B COMPLEX PO) Take 1 tablet by mouth daily.   Yes [provider]  benzonatate (TESSALON) 100 MG capsule Take 1 capsule (100 mg total)  by mouth every 8 (eight) hours. 08/07/23  Yes Raspet, Erin K, PA-C  CLONIDINE HCL PO Take 0.5 mg by mouth at bedtime.   Yes [provider]  ezetimibe-simvastatin (VYTORIN) 10-80 MG per tablet Take 1 tablet by mouth at bedtime.     Yes [provider]  fluticasone (FLONASE) 50 MCG/ACT nasal spray Place 2 sprays into both nostrils daily. 08/15/23  Yes Zenia Resides, MD  Garlic 10 MG CAPS Take 1 tablet by mouth daily as needed.   Yes [provider]  levothyroxine (SYNTHROID) 125 MCG tablet Take 1 tablet (125 mcg total) by mouth daily. 11/13/22  Yes   metFORMIN (GLUCOPHAGE) 500 MG tablet Take 500 mg by mouth 2 (two) times daily with a meal.     Yes [provider]  Multiple Vitamin (MULTIVITAMIN) capsule Take 1 capsule by mouth daily.   Yes [provider]  tirzepatide Greggory Keen) 7.5 MG/0.5ML Pen Inject 7.5 mg into the skin once a week. 10/16/22  Yes   triamcinolone (NASACORT) 55 MCG/ACT nasal inhaler Place 2 sprays into the nose daily.     Yes [provider]    Family History History reviewed. No pertinent family history.  Social History Social History   Tobacco Use   Smoking status: Never   Smokeless tobacco: Never  Vaping Use  Vaping status: Never Used  Substance Use Topics   Alcohol use: No   Drug use: No     Allergies   Empagliflozin   Review of Systems Review of Systems   Physical Exam Triage Vital Signs ED Triage Vitals  Encounter Vitals Group     BP 08/15/23 1044 (!) 155/90     Systolic BP Percentile --      Diastolic BP Percentile --      Pulse Rate 08/15/23 1044 68     Resp 08/15/23 1044 18     Temp 08/15/23 1044 98.5 F (36.9 C)     Temp Source 08/15/23 1044 Oral     SpO2 08/15/23 1044 97 %     Weight 08/15/23 1044 190 lb (86.2 kg)     Height 08/15/23 1044 5\' 8"  (1.727 m)     Head Circumference --      Peak Flow --      Pain Score 08/15/23 1042 0     Pain Loc --      Pain Education --       Exclude from Growth Chart --    No data found.  Updated Vital Signs BP (!) 155/90 (BP Location: Left Arm)   Pulse 68   Temp 98.5 F (36.9 C) (Oral)   Resp 18   Ht 5\' 8"  (1.727 m)   Wt 86.2 kg   SpO2 97%   BMI 28.89 kg/m   Visual Acuity Right Eye Distance:   Left Eye Distance:   Bilateral Distance:    Right Eye Near:   Left Eye Near:    Bilateral Near:     Physical Exam Vitals reviewed.  Constitutional:      General: She is not in acute distress.    Appearance: She is not toxic-appearing.  HENT:     Right Ear: Tympanic membrane and ear canal normal.     Left Ear: Tympanic membrane and ear canal normal.     Nose: Congestion present.     Mouth/Throat:     Mouth: Mucous membranes are moist.     Comments: There is some white mucus draining in the oropharynx.  No erythema or asymmetry Eyes:     Extraocular Movements: Extraocular movements intact.     Conjunctiva/sclera: Conjunctivae normal.     Pupils: Pupils are equal, round, and reactive to light.  Cardiovascular:     Rate and Rhythm: Normal rate and regular rhythm.     Heart sounds: No murmur heard. Pulmonary:     Effort: Pulmonary effort is normal. No respiratory distress.     Breath sounds: No stridor. No wheezing, rhonchi or rales.  Musculoskeletal:     Cervical back: Neck supple.  Lymphadenopathy:     Cervical: No cervical adenopathy.  Skin:    Capillary Refill: Capillary refill takes less than 2 seconds.     Coloration: Skin is not jaundiced or pale.  Neurological:     General: No focal deficit present.     Mental Status: She is alert and oriented to person, place, and time.  Psychiatric:        Behavior: Behavior normal.      UC Treatments / Results  Labs (all labs ordered are listed, but only abnormal results are displayed) Labs Reviewed - No data to display  EKG   Radiology No results found.  Procedures Procedures (including critical care time)  Medications Ordered in UC Medications -  No data to display  Initial Impression / Assessment  and Plan / UC Course  I have reviewed the triage vital signs and the nursing notes.  Pertinent labs & imaging results that were available during my care of the patient were reviewed by me and considered in my medical decision making (see chart for details).   I believe she has developed an acute sinusitis.  Amoxicillin was sent in for 5 days and Flonase is sent in to help  I did ask her to restart all her routine medications. Final Clinical Impressions(s) / UC Diagnoses   Final diagnoses:  Acute maxillary sinusitis, recurrence not specified     Discharge Instructions      I think you have possibly developed a sinus infection  Take amoxicillin 500 mg--1 tab 3 times daily for 5 days  Fluticasone/Flonase nose spray--put 2 sprays in each nostril once daily  Please restart all your usual medications today      ED Prescriptions     Medication Sig Dispense Auth. Provider   fluticasone (FLONASE) 50 MCG/ACT nasal spray Place 2 sprays into both nostrils daily. 16 g Zenia Resides, MD   amoxicillin (AMOXIL) 500 MG tablet Take 1 tablet (500 mg total) by mouth in the morning, at noon, and at bedtime for 5 days. 15 tablet Azriel Dancy, Janace Aris, MD      PDMP not reviewed this encounter.   Zenia Resides, MD 08/15/23 1130

## 2023-08-15 NOTE — ED Triage Notes (Signed)
Nasal congestion (covid positive), weakness, and cough as of yesterday. States finished the meds she was given on Monday. Still having symptoms return as of Tuesday.

## 2024-02-24 ENCOUNTER — Encounter (HOSPITAL_COMMUNITY): Payer: Self-pay

## 2024-02-24 ENCOUNTER — Ambulatory Visit (HOSPITAL_COMMUNITY): Admission: EM | Admit: 2024-02-24 | Discharge: 2024-02-24 | Disposition: A | Discharge status: Home / Self Care

## 2024-02-24 DIAGNOSIS — T63441A Toxic effect of venom of bees, accidental (unintentional), initial encounter: Secondary | ICD-10-CM

## 2024-02-24 NOTE — Discharge Instructions (Addendum)
 You were seen today for a localized reaction to an insect sting that occurred earlier today. The area shows mild inflammation but no signs of an allergic reaction. Clean the sting site gently with soap and water and apply an ice pack for 10 to 15 minutes at a time throughout the day to reduce swelling and discomfort. Over-the-counter antihistamines like Benadryl or pain relievers such as ibuprofen may be used if needed for itching or soreness. No antibiotics are needed at this time. Monitor the area for signs of infection such as increased redness, warmth, swelling, or drainage. Seek immediate medical attention if you develop symptoms of an allergic reaction such as difficulty breathing, swelling of the face or throat, widespread hives, dizziness, or if the site becomes increasingly painful or shows signs of infection. Follow up with your primary care provider if symptoms worsen or do not improve within a few days.

## 2024-02-24 NOTE — ED Provider Notes (Signed)
 [facility]    CSN: [id number] Arrival date & time: 02/24/24  1107      History   Chief Complaint Chief Complaint  Patient presents with   Insect Bite    HPI Sydney Daugherty is a 78 y.o. female.   Discussed the use of AI scribe software for clinical note transcription with the patient, who gave verbal consent to proceed.    Sydney Daugherty is a 78 y.o. female with a bee or wasp sting that occurred this morning while unloading groceries from her car. The patient reports a stinging sensation at the site of the sting, which she initially described as stinging real bad but notes it has since decreased in intensity. The patient states she saw what she thought was a wasp or something similar fly by, and she swatted at it. Upon entering her house, she noticed the stinging sensation to the back of her right arm. In response, she applied rubbing alcohol to the affected area based on information she found online. The patient denies any itching, difficulty swallowing, or other symptoms beyond the localized stinging sensation. 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   The following portions of the patient's history were reviewed and updated as appropriate: allergies, current medications, past family history, past medical history, past social history, past surgical history, and problem list.    Past Medical History:  Diagnosis Date   Asthma    CPAP (continuous positive airway pressure) dependence    as needed not used in 3-4 years   Diabetes mellitus    Hypertension     There are no active problems to display for this patient.   Past Surgical History:  Procedure Laterality Date   ABDOMINAL HYSTERECTOMY      OB History   No obstetric history on file.      Home Medications    Prior to Admission medications   Medication Sig Start  Date End Date Taking? Authorizing Provider  Glucos-Chondroit-Hyaluron-MSM (GLUCOSAMINE CHONDROITIN JOINT PO) Take by mouth. 10/07/20  Yes [provider]  amLODipine-olmesartan (AZOR) 10-40 MG tablet Take 1 tablet by mouth daily. 07/30/17   [provider]  B Complex Vitamins (VITAMIN B COMPLEX PO) Take 1 tablet by mouth daily.    [provider]  CLONIDINE HCL PO Take 0.5 mg by mouth at bedtime.    [provider]  ezetimibe-simvastatin (VYTORIN) 10-80 MG per tablet Take 1 tablet by mouth at bedtime.      [provider]  levothyroxine  (SYNTHROID ) 125 MCG tablet Take 1 tablet (125 mcg total) by mouth daily. 11/13/22     metFORMIN (GLUCOPHAGE) 500 MG tablet Take 500 mg by mouth 2 (two) times daily with a meal.      [provider]  Multiple Vitamin (MULTIVITAMIN) capsule Take 1 capsule by mouth daily.    [provider]  tirzepatide  (MOUNJARO ) 7.5 MG/0.5ML Pen Inject 7.5 mg into the skin once a week. 10/16/22     triamcinolone (NASACORT) 55 MCG/ACT nasal inhaler Place 2 sprays into the nose daily.      [provider]    Family History History reviewed. No pertinent family history.  Social History Social History   Tobacco Use   Smoking status: Never   Smokeless tobacco: Never  Vaping Use   Vaping status: Never Used  Substance Use Topics  Alcohol use: No   Drug use: No     Allergies   Empagliflozin   Review of Systems Review of Systems  HENT:  Negative for trouble swallowing.   Respiratory:  Negative for chest tightness and shortness of breath.   Cardiovascular:  Negative for chest pain.  Skin:  Positive for wound.  All other systems reviewed and are negative.    Physical Exam Triage Vital Signs ED Triage Vitals  Encounter Vitals Group     BP 02/24/24 1201 (!) 141/82     Girls Systolic BP Percentile --      Girls Diastolic BP Percentile --      Boys Systolic BP Percentile --      Boys Diastolic BP  Percentile --      Pulse Rate 02/24/24 1201 60     Resp 02/24/24 1201 16     Temp 02/24/24 1201 98.6 F (37 C)     Temp Source 02/24/24 1201 Oral     SpO2 02/24/24 1201 98 %     Weight --      Height --      Head Circumference --      Peak Flow --      Pain Score 02/24/24 1203 6     Pain Loc --      Pain Education --      Exclude from Growth Chart --    No data found.  Updated Vital Signs BP (!) 141/82 (BP Location: Left Arm)   Pulse 60   Temp 98.6 F (37 C) (Oral)   Resp 16   SpO2 98%   Visual Acuity Right Eye Distance:   Left Eye Distance:   Bilateral Distance:    Right Eye Near:   Left Eye Near:    Bilateral Near:     Physical Exam Vitals reviewed.  Constitutional:      General: She is not in acute distress.    Appearance: Normal appearance. She is not toxic-appearing.  HENT:     Head: Normocephalic.     Mouth/Throat:     Mouth: Mucous membranes are moist.   Eyes:     Conjunctiva/sclera: Conjunctivae normal.    Cardiovascular:     Rate and Rhythm: Normal rate and regular rhythm.     Heart sounds: Normal heart sounds.  Pulmonary:     Effort: Pulmonary effort is normal.     Breath sounds: Normal breath sounds.   Musculoskeletal:        General: Normal range of motion.   Skin:    General: Skin is warm and dry.     Findings: Wound present.     Comments: Small area of local inflammation noted at sting site of the lateral aspect of the right upper arm. No visible stinger present. (See image below)    Neurological:     General: No focal deficit present.     Mental Status: She is alert and oriented to person, place, and time.      UC Treatments / Results  Labs (all labs ordered are listed, but only abnormal results are displayed) Labs Reviewed - No data to display  EKG   Radiology No results found.  Procedures Procedures (including critical care time)  Medications Ordered in UC Medications - No data to display  Initial Impression /  Assessment and Plan / UC Course  I have reviewed the triage vital signs and the nursing notes.  Pertinent labs & imaging results that were available during my  care of the patient were reviewed by me and considered in my medical decision making (see chart for details).     The patient presents with a localized reaction to an insect sting sustained earlier today while unloading groceries. Symptoms include a stinging sensation at the site, which has improved since the incident. Examination shows mild localized inflammation without evidence of systemic allergic response. Although the patient reports a childhood bee allergy, there have been no adult reactions, and current symptoms are consistent with a normal localized response rather than an allergic one. The area was cleaned with soap and water, and the patient was advised to apply ice for relief. No antibiotics were prescribed, and the patient was educated on the difference between normal and allergic reactions to insect stings. No follow-up is necessary unless symptoms worsen or signs of systemic reaction occur.  Today's evaluation has revealed no signs of a dangerous process. Discussed diagnosis with patient and/or guardian. Patient and/or guardian aware of their diagnosis, possible red flag symptoms to watch out for and need for close follow up. Patient and/or guardian understands verbal and written discharge instructions. Patient and/or guardian comfortable with plan and disposition.  Patient and/or guardian has a clear mental status at this time, good insight into illness (after discussion and teaching) and has clear judgment to make decisions regarding their care  Documentation was completed with the aid of voice recognition software. Transcription may contain typographical errors. Final Clinical Impressions(s) / UC Diagnoses   Final diagnoses:  Bee sting, accidental or unintentional, initial encounter     Discharge Instructions      You  were seen today for a localized reaction to an insect sting that occurred earlier today. The area shows mild inflammation but no signs of an allergic reaction. Clean the sting site gently with soap and water and apply an ice pack for 10 to 15 minutes at a time throughout the day to reduce swelling and discomfort. Over-the-counter antihistamines like Benadryl or pain relievers such as ibuprofen may be used if needed for itching or soreness. No antibiotics are needed at this time. Monitor the area for signs of infection such as increased redness, warmth, swelling, or drainage. Seek immediate medical attention if you develop symptoms of an allergic reaction such as difficulty breathing, swelling of the face or throat, widespread hives, dizziness, or if the site becomes increasingly painful or shows signs of infection. Follow up with your primary care provider if symptoms worsen or do not improve within a few days.      ED Prescriptions   None    PDMP not reviewed this encounter.   Iola Lukes, [place] 02/24/24 1258

## 2024-02-24 NOTE — ED Triage Notes (Signed)
 Patient here today with c/o a bee sting about 2 hours ago on her left upper arm. Patient states that the area stings. Denies other symptoms.

## 2024-06-09 ENCOUNTER — Ambulatory Visit: Payer: Self-pay | Admitting: Podiatry

## 2024-09-22 ENCOUNTER — Encounter: Payer: Self-pay | Admitting: Podiatry

## 2024-09-22 ENCOUNTER — Ambulatory Visit: Admitting: Podiatry

## 2024-09-22 DIAGNOSIS — D125 Benign neoplasm of sigmoid colon: Secondary | ICD-10-CM | POA: Insufficient documentation

## 2024-09-22 DIAGNOSIS — E119 Type 2 diabetes mellitus without complications: Secondary | ICD-10-CM

## 2024-09-22 DIAGNOSIS — L853 Xerosis cutis: Secondary | ICD-10-CM | POA: Diagnosis not present

## 2024-09-22 DIAGNOSIS — Z0189 Encounter for other specified special examinations: Secondary | ICD-10-CM

## 2024-09-22 DIAGNOSIS — E1165 Type 2 diabetes mellitus with hyperglycemia: Secondary | ICD-10-CM | POA: Insufficient documentation

## 2024-09-22 DIAGNOSIS — M2141 Flat foot [pes planus] (acquired), right foot: Secondary | ICD-10-CM | POA: Diagnosis not present

## 2024-09-22 DIAGNOSIS — M2012 Hallux valgus (acquired), left foot: Secondary | ICD-10-CM

## 2024-09-22 DIAGNOSIS — J45909 Unspecified asthma, uncomplicated: Secondary | ICD-10-CM | POA: Insufficient documentation

## 2024-09-22 DIAGNOSIS — M79675 Pain in left toe(s): Secondary | ICD-10-CM | POA: Diagnosis not present

## 2024-09-22 DIAGNOSIS — Z83719 Family history of colon polyps, unspecified: Secondary | ICD-10-CM | POA: Insufficient documentation

## 2024-09-22 DIAGNOSIS — Z6830 Body mass index (BMI) 30.0-30.9, adult: Secondary | ICD-10-CM | POA: Insufficient documentation

## 2024-09-22 DIAGNOSIS — M2011 Hallux valgus (acquired), right foot: Secondary | ICD-10-CM | POA: Diagnosis not present

## 2024-09-22 DIAGNOSIS — M79674 Pain in right toe(s): Secondary | ICD-10-CM | POA: Diagnosis not present

## 2024-09-22 DIAGNOSIS — M1990 Unspecified osteoarthritis, unspecified site: Secondary | ICD-10-CM | POA: Insufficient documentation

## 2024-09-22 DIAGNOSIS — M2142 Flat foot [pes planus] (acquired), left foot: Secondary | ICD-10-CM | POA: Diagnosis not present

## 2024-09-22 DIAGNOSIS — B351 Tinea unguium: Secondary | ICD-10-CM | POA: Diagnosis not present

## 2024-09-22 MED ORDER — AMMONIUM LACTATE 12 % EX LOTN: TOPICAL_LOTION | CUTANEOUS | 5 refills | Status: AC

## 2024-09-27 NOTE — Progress Notes (Signed)
 "  Subjective: Sydney Daugherty presents today for for diabetic foot evaluation and with chief concern of diabetes with elongated, thickened, painful, discolored toenails for years. Aggravating factor(s) include wearing enclosed shoe gear. 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  Patient has tried prior visit with community Podiatrist. Chief Complaint  Patient presents with   Diabetes    DFC NIDDM A1C 6.5. LOV with PCP upcomming in march. Dr. Janise Rend. Toenail trim and Dry skin.    Past Medical History:  Diagnosis Date   Asthma    CPAP (continuous positive airway pressure) dependence    as needed not used in 3-4 years   Diabetes mellitus    Hypertension     Patient Active Problem List   Diagnosis Date Noted   Adenoma of sigmoid colon 09/22/2024   Asthma 09/22/2024   Family history of colonic polyps 09/22/2024   Arthritis 09/22/2024   Adult BMI 30.0-30.9 kg/sq m 09/22/2024   Type 2 diabetes mellitus with hyperglycemia (HCC) 09/22/2024   Encounter for diabetic foot exam (HCC) 09/22/2024   Hallux valgus, acquired, bilateral 09/22/2024   Controlled type 2 diabetes mellitus without complication, without long-term current use of insulin (HCC) 09/22/2024   Xerosis cutis 09/22/2024   Pain due to onychomycosis of toenails of both feet 09/22/2024   Diverticulosis of large intestine without perforation or abscess without bleeding 05/22/2022   Benign neoplasm of descending colon 06/13/2018   Diaphragmatic hernia without obstruction or gangrene 05/30/2018    Past Surgical History:  Procedure Laterality Date   ABDOMINAL HYSTERECTOMY     Medications Ordered Prior to Encounter[1]   Allergies[2]  Social History   Occupational History   Not on file  Tobacco Use   Smoking status: Never   Smokeless tobacco: Never  Vaping Use   Vaping status: Never Used  Substance and Sexual Activity   Alcohol use: No   Drug use: No   Sexual  activity: Not Currently    History reviewed. No pertinent family history.  Immunization History  Administered Date(s) Administered   Pfizer(Comirnaty)Fall Seasonal Vaccine 12 years and older 09/09/2022    Objective: There were no vitals filed for this visit.  Sydney Daugherty is a pleasant 79 y.o. female in NAD. AAO X 3.   Diabetic foot exam was performed with the following findings:   Vascular Examination: Capillary refill time immediate b/l. Vascular status intact b/l with palpable pedal pulses. Pedal hair present b/l. No pain with calf compression b/l. Skin temperature gradient WNL b/l. No cyanosis or clubbing b/l. No ischemia or gangrene noted b/l. No edema noted b/l LE.  Neurological Examination: Sensation grossly intact b/l with 10 gram monofilament. Vibratory sensation intact b/l.   Dermatological Examination: Pedal skin with normal turgor, texture and tone b/l.  No open wounds. No interdigital macerations.   Toenails 2-5 b/l thick, discolored, elongated with subungual debris and pain on dorsal palpation.   Toenail(s) bilateral great toes elongated, discolored, dystrophic, thickened >1/4 inch. Nails are crumbly with subungual debris and there is exquisite tenderness to dorsal palpation. No subungual wound(s) noted.  Musculoskeletal Examination: Muscle strength 5/5 to all lower extremity muscle groups bilaterally. No pain, crepitus or joint limitation noted with ROM bilateral LE. HAV with bunion deformity noted b/l LE. Pes planus deformity noted bilateral LE. Utilizes cane for ambulation assistance.  Radiographs: None      Assessment: 1. Pain due to onychomycosis of toenails of both feet   2. Xerosis  cutis   3. Controlled type 2 diabetes mellitus without complication, without long-term current use of insulin (HCC)   4. Hallux valgus, acquired, bilateral   5. Pes planus of both feet   6. Encounter for diabetic foot exam (HCC)     ADA Risk Categorization: Low Risk:   Patient has all of the following: Intact protective sensation No prior foot ulcer  No severe deformity Pedal pulses present  Plan: Meds ordered this encounter  Medications   ammonium lactate  (LAC-HYDRIN ) 12 % lotion    Sig: Apply to both feet twice daily.    Dispense:  400 g    Refill:  5  Diabetic foot examination performed today. All patient's and/or POA's questions/concerns addressed on today's visit. Toenails 1-5 b/l debrided in length and girth without incident. Continue foot and shoe inspections daily. Monitor blood glucose per PCP/Endocrinologist's recommendations. Continue soft, supportive shoe gear daily. Report any pedal injuries to medical professional. Call office if there are any questions/concerns. -For xerosis, Rx sent for Ammonium Lactate  Lotion 12%. Apply to feet twice daily avoiding application between toes. -Patient/POA to call should there be question/concern in the interim.  Return in about 3 months (around 12/21/2024).  Delon LITTIE Merlin, DPM      [place] LOCATION: [address], [place] [address]                   Office [phone]   [place] LOCATION: [address] [place], [place] [address] Office [phone]     [1]  Current Outpatient Medications on File Prior to Visit  Medication Sig Dispense Refill   amLODipine-olmesartan (AZOR) 10-40 MG tablet Take 1 tablet by mouth daily.  2   B Complex Vitamins (VITAMIN B COMPLEX PO) Take 1 tablet by mouth daily.     CLONIDINE HCL PO Take 0.5 mg by mouth at bedtime.     ezetimibe-simvastatin (VYTORIN) 10-80 MG per tablet Take 1 tablet by mouth at bedtime.       Glucos-Chondroit-Hyaluron-MSM (GLUCOSAMINE CHONDROITIN JOINT PO) Take by mouth.     levothyroxine  (SYNTHROID ) 125 MCG tablet Take 1 tablet (125 mcg total) by mouth daily. 30 tablet 8   metFORMIN (GLUCOPHAGE) 500 MG tablet Take 500 mg by mouth 2 (two) times daily with a meal.        Multiple Vitamin (MULTIVITAMIN) capsule Take 1 capsule by mouth daily.     tirzepatide  (MOUNJARO ) 7.5 MG/0.5ML Pen Inject 7.5 mg into the skin once a week. 2 mL 0   triamcinolone (NASACORT) 55 MCG/ACT nasal inhaler Place 2 sprays into the nose daily.       No current facility-administered medications on file prior to visit.  [2]  Allergies Allergen Reactions   Bee Pollen    Empagliflozin     severe yeast infectionsyeast infections   "

## 2024-10-30 ENCOUNTER — Ambulatory Visit (HOSPITAL_BASED_OUTPATIENT_CLINIC_OR_DEPARTMENT_OTHER): Admitting: Orthopaedic Surgery

## 2024-12-23 ENCOUNTER — Ambulatory Visit: Admitting: Podiatry
